# Patient Record
Sex: Female | Born: 1990 | Hispanic: Yes | Marital: Married
Health system: Southern US, Community
[De-identification: ages and names within clinical notes are randomized; demographics above are authoritative.]

## PROBLEM LIST (undated history)

## (undated) DIAGNOSIS — G43909 Migraine, unspecified, not intractable, without status migrainosus: Secondary | ICD-10-CM

---

## 2016-03-12 ENCOUNTER — Emergency Department
Admission: EM | Admit: 2016-03-12 | Discharge: 2016-03-13 | Disposition: A | Discharge status: Home / Self Care | Payer: Self-pay | Attending: Emergency Medicine | Admitting: Emergency Medicine

## 2016-03-12 ENCOUNTER — Encounter: Payer: Self-pay | Admitting: Emergency Medicine

## 2016-03-12 DIAGNOSIS — F419 Anxiety disorder, unspecified: Secondary | ICD-10-CM | POA: Insufficient documentation
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
## 2016-03-12 NOTE — Discharge Instructions (Signed)
Follow-up with your primary care provider or [facility] as discussed. Take care of each other and talk through your fears and concerns. Good luck to you.
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
## 2019-11-06 ENCOUNTER — Encounter (HOSPITAL_COMMUNITY): Payer: Self-pay | Admitting: *Deleted

## 2019-11-06 ENCOUNTER — Emergency Department (HOSPITAL_COMMUNITY): Payer: Self-pay

## 2019-11-06 ENCOUNTER — Other Ambulatory Visit: Payer: Self-pay

## 2019-11-06 ENCOUNTER — Emergency Department (HOSPITAL_COMMUNITY)
Admission: EM | Admit: 2019-11-06 | Discharge: 2019-11-07 | Disposition: A | Discharge status: Home / Self Care | Payer: Self-pay | Attending: Emergency Medicine | Admitting: Emergency Medicine

## 2019-11-06 DIAGNOSIS — R10A0 Flank pain, unspecified side: Secondary | ICD-10-CM

## 2019-11-06 DIAGNOSIS — M62830 Muscle spasm of back: Secondary | ICD-10-CM | POA: Insufficient documentation

## 2019-11-06 DIAGNOSIS — R1032 Left lower quadrant pain: Secondary | ICD-10-CM | POA: Insufficient documentation

## 2019-11-06 DIAGNOSIS — R109 Unspecified abdominal pain: Secondary | ICD-10-CM

## 2019-11-06 DIAGNOSIS — R9389 Abnormal findings on diagnostic imaging of other specified body structures: Secondary | ICD-10-CM

## 2019-11-06 DIAGNOSIS — M62838 Other muscle spasm: Secondary | ICD-10-CM

## 2019-11-06 LAB — URINALYSIS, ROUTINE W REFLEX MICROSCOPIC
Bilirubin Urine: NEGATIVE
Glucose, UA: NEGATIVE mg/dL
Hgb urine dipstick: NEGATIVE
Ketones, ur: NEGATIVE mg/dL
Leukocytes,Ua: NEGATIVE
Nitrite: NEGATIVE
Protein, ur: NEGATIVE mg/dL
Specific Gravity, Urine: 1.028 (ref 1.005–1.030)
pH: 6 (ref 5.0–8.0)

## 2019-11-06 LAB — BASIC METABOLIC PANEL
Anion gap: 8 (ref 5–15)
BUN: 13 mg/dL (ref 6–20)
CO2: 24 mmol/L (ref 22–32)
Calcium: 8.4 mg/dL — ABNORMAL LOW (ref 8.9–10.3)
Chloride: 105 mmol/L (ref 98–111)
Creatinine, Ser: 0.69 mg/dL (ref 0.44–1.00)
GFR calc Af Amer: >60 mL/min (ref >60)
GFR calc non Af Amer: >60 mL/min (ref >60)
Glucose, Bld: 108 mg/dL — ABNORMAL HIGH (ref 70–99)
Potassium: 4 mmol/L (ref 3.5–5.1)
Sodium: 137 mmol/L (ref 135–145)

## 2019-11-06 LAB — I-STAT BETA HCG BLOOD, ED (MC, WL, AP ONLY): I-stat hCG, quantitative: <5 m[IU]/mL (ref <5)

## 2019-11-06 LAB — CBC
HCT: 33.5 % — ABNORMAL LOW (ref 36.0–46.0)
Hemoglobin: 10.2 g/dL — ABNORMAL LOW (ref 12.0–15.0)
MCH: 23.8 pg — ABNORMAL LOW (ref 26.0–34.0)
MCHC: 30.4 g/dL (ref 30.0–36.0)
MCV: 78.3 fL — ABNORMAL LOW (ref 80.0–100.0)
Platelets: 291 10*3/uL (ref 150–400)
RBC: 4.28 MIL/uL (ref 3.87–5.11)
RDW: 16.7 % — ABNORMAL HIGH (ref 11.5–15.5)
WBC: 10.4 10*3/uL (ref 4.0–10.5)
nRBC: 0 % (ref 0.0–0.2)

## 2019-11-06 MED ORDER — KETOROLAC TROMETHAMINE 30 MG/ML IJ SOLN
30.0000 mg | Freq: Once | INTRAMUSCULAR | Status: AC
  Administered 2019-11-06: 30 mg via INTRAVENOUS
  Filled 2019-11-06: qty 1

## 2019-11-06 NOTE — ED Provider Notes (Signed)
[facility] EMERGENCY DEPARTMENT Provider Note   CSN: [id number] Arrival date & time: 11/06/19  2144     History Chief Complaint  Patient presents with  . Flank Pain    Jaime Griffin is a 29 y.o. female.  Patient presents to the emergency department with a chief complaint of left flank pain.  She reports having fairly mild left flank pain for the past week, but it suddenly worsened today.  She states that she is very uncomfortable.  She rates her pain as severe.  She denies any fevers or chills.  Denies nausea or vomiting.  Denies any injury.  Denies any successful treatments prior to arrival.  She is unable to find a comfortable position.  Denies any history of kidney stones.  The history is provided by the patient. No language interpreter was used.       History reviewed. No pertinent past medical history.  There are no problems to display for this patient.   Past Surgical History:  Procedure Laterality Date  . CESAREAN SECTION       OB History   No obstetric history on file.     No family history on file.  Social History   Tobacco Use  . Smoking status: Never Smoker  . Smokeless tobacco: Never Used  Substance Use Topics  . Alcohol use: Yes  . Drug use: No    Home Medications Prior to Admission medications   Not on File    Allergies    Patient has no known allergies.  Review of Systems   Review of Systems  All other systems reviewed and are negative.   Physical Exam Updated Vital Signs BP 117/82 (BP Location: Right Arm)   Pulse 68   Temp 98.3 F (36.8 C) (Oral)   Resp 18   LMP 10/15/2019   SpO2 100%   Physical Exam Vitals and nursing note reviewed.  Constitutional:      General: She is not in acute distress.    Appearance: She is well-developed.  HENT:     Head: Normocephalic and atraumatic.  Eyes:     Conjunctiva/sclera: Conjunctivae normal.  Cardiovascular:     Rate and Rhythm: Normal rate and regular rhythm.   Heart sounds: No murmur.  Pulmonary:     Effort: Pulmonary effort is normal. No respiratory distress.     Breath sounds: Normal breath sounds.  Abdominal:     Palpations: Abdomen is soft.     Tenderness: There is no abdominal tenderness. There is left CVA tenderness.  Musculoskeletal:     Cervical back: Neck supple.     Comments: Left-sided lumbar paraspinal muscles tender to palpation  Skin:    General: Skin is warm and dry.  Neurological:     Mental Status: She is alert and oriented to person, place, and time.  Psychiatric:        Mood and Affect: Mood normal.        Behavior: Behavior normal.     ED Results / Procedures / Treatments   Labs (all labs ordered are listed, but only abnormal results are displayed) Labs Reviewed  CBC - Abnormal; Notable for the following components:      Result Value   Hemoglobin 10.2 (*)    HCT 33.5 (*)    MCV 78.3 (*)    MCH 23.8 (*)    RDW 16.7 (*)    All other components within normal limits  BASIC METABOLIC PANEL - Abnormal; Notable for the following  components:   Glucose, Bld 108 (*)    Calcium 8.4 (*)    All other components within normal limits  URINALYSIS, ROUTINE W REFLEX MICROSCOPIC  I-STAT BETA HCG BLOOD, ED ([facility], [facility], [facility] ONLY)    EKG None  Radiology No results found.  Procedures Procedures (including critical care time)  Medications Ordered in ED Medications  ketorolac (TORADOL) 30 MG/ML injection 30 mg (has no administration in time range)    ED Course  I have reviewed the triage vital signs and the nursing notes.  Pertinent labs & imaging results that were available during my care of the patient were reviewed by me and considered in my medical decision making (see chart for details).    MDM Rules/Calculators/A&P                      Patient with significant left flank pain.  Pain have been mild for the the past week or so, but significantly worsened today without any injury.  She appears quite uncomfortable.   Differential includes kidney stone versus muscle spasm.  hCG negative.  CT scan is negative for obstructive uropathy.  There is incidental finding of thickened endometrial stripe.  I have recommended that the patient follow-up with OB/GYN for nonemergent ultrasound.  I suspect that the patient symptoms tonight are secondary to muscle spasm.  She feels improved with Toradol.  Will discharge home with Flexeril.   Final Clinical Impression(s) / ED Diagnoses Final diagnoses:  Flank pain  Muscle spasm  Increased endometrial stripe thickness    Rx / DC Orders ED Discharge Orders    None       Roxy Horseman, PA-C 11/07/19 0030    Palumbo, April, MD 11/07/19 8325

## 2019-11-06 NOTE — ED Triage Notes (Signed)
Left sided flank pain, tender to palpation for about a week. Denies urinary symptoms. Seen at [facility] prior, sent for further eval.

## 2019-11-07 MED ORDER — CYCLOBENZAPRINE HCL 10 MG PO TABS: 10.0000 mg | ORAL_TABLET | Freq: Two times a day (BID) | ORAL | 0 refills | Status: AC | PRN

## 2019-11-07 NOTE — Discharge Instructions (Signed)
This CT scan did not show any emergent causes for your pain.  Your pain is thought to be from muscle tightness and spasm.  There was an incidental finding on the CT scan which showed thickening of your endometrial stripe.  This can be an indication of uterine fibroids or other uterine pathology.  We recommend that you follow-up with an OB/GYN.  You will likely need a nonemergent ultrasound for further diagnostic work-up regarding this issue.  This is not thought to be the cause of your pain.  Please use ibuprofen and the muscle relaxer that was prescribed.  Please apply heat and do gentle stretching.  Return to the emergency department for fever, vomiting, or abdominal pain.

## 2019-11-07 NOTE — ED Notes (Signed)
Patient verbalizes understanding of discharge instructions. Opportunity for questioning and answers were provided. Armband removed by staff, pt discharged from ED. Pt. ambulatory and discharged home.  

## 2020-12-10 ENCOUNTER — Other Ambulatory Visit: Payer: Self-pay

## 2020-12-10 ENCOUNTER — Emergency Department (HOSPITAL_COMMUNITY)
Admission: EM | Admit: 2020-12-10 | Discharge: 2020-12-10 | Disposition: A | Discharge status: Home / Self Care | Payer: Self-pay | Attending: Emergency Medicine | Admitting: Emergency Medicine

## 2020-12-10 ENCOUNTER — Encounter (HOSPITAL_COMMUNITY): Payer: Self-pay | Admitting: Emergency Medicine

## 2020-12-10 DIAGNOSIS — R21 Rash and other nonspecific skin eruption: Secondary | ICD-10-CM | POA: Insufficient documentation

## 2020-12-10 MED ORDER — FAMOTIDINE 20 MG PO TABS: 20.0000 mg | ORAL_TABLET | Freq: Two times a day (BID) | ORAL | 0 refills | Status: AC

## 2020-12-10 MED ORDER — FAMOTIDINE 20 MG PO TABS
20.0000 mg | ORAL_TABLET | Freq: Once | ORAL | Status: AC
  Administered 2020-12-10: 20 mg via ORAL
  Filled 2020-12-10: qty 1

## 2020-12-10 NOTE — Discharge Instructions (Signed)
Please follow up with your PCP regarding your ED visit today. They may need to refer you to an allergist for specific allergy testing.   Please add the Pepcid twice daily to your benadryl and Prednisone regimen to help with your symptoms.   It is recommended that you wash all of your bedding, sheets, towels, clothes in free and clear detergent and to switch hygiene products to free and clear as you may be reacting to dyes or perfumes.   Return to the ED for any worsening symptoms including throat swelling, face swelling, shortness of breath, wheezing, abdominal pain, vomiting, or any other associated symptoms.

## 2020-12-10 NOTE — ED Triage Notes (Addendum)
C/o rash since Monday that started on hands and spread.  Also reports joint pain.  Seen at [facility] on Thursday and had negative step test.  Taking Prednisone and Penicillin without improvement.  Taking Benadryl and using cortisone, oatmeal, and aloe vera without relief.  Pt used gloves on Sunday and put lice treatment on her children.  No other new products.

## 2020-12-10 NOTE — ED Provider Notes (Signed)
[facility] EMERGENCY DEPARTMENT Provider Note   CSN: [id number] Arrival date & time: 12/10/20  1037     History Chief Complaint  Patient presents with  . Rash    Jaime Griffin is a 30 y.o. female who presents to the ED today with complaint of rash for the past 6 days.  Patient reports that she woke up Monday morning with an itchy rash to her bilateral hands that has since spread to her body.  She reports that a couple of days later she woke up with a fever at 102.0 as well as a sore throat.  She states she felt like something was inside her throat and it was swelling.  She went to urgent care at that point and they subsequently treated her for strep throat with prednisone and penicillin.  She was swabbed for strep at that time however it returned negative.  She reports that she was having some bilateral hand swelling which is seem to decrease with the prednisone however she continues to have itching.  She states she has been taking Benadryl 50 mg daily without relief of her symptoms.  She last took Benadryl approximately 1 hour ago.  She is currently on day 4 of prednisone pack.  She mentions that Sunday before she woke up with a rash she treated her children for lice however was wearing gloves when she put the shampoo on their heads.  She denies any other new foods, medications, hygiene products.  She denies any current sore throat, throat swelling, shortness of breath, wheezing, abdominal pain, nausea, vomiting.  She reports she has been unable to sleep very well due to the itchiness prompting her to come back to the ED today.   The history is provided by the patient and medical records.       History reviewed. No pertinent past medical history.  There are no problems to display for this patient.   Past Surgical History:  Procedure Laterality Date  . CESAREAN SECTION       OB History   No obstetric history on file.     No family history on file.  Social  History   Tobacco Use  . Smoking status: Never Smoker  . Smokeless tobacco: Never Used  Substance Use Topics  . Alcohol use: Yes  . Drug use: No    Home Medications Prior to Admission medications   Medication Sig Start Date End Date Taking? Authorizing Provider  famotidine (PEPCID) 20 MG tablet Take 1 tablet (20 mg total) by mouth 2 (two) times daily for 15 days. 12/10/20 12/25/20 Yes Naquan Garman, PA-C  cyclobenzaprine (FLEXERIL) 10 MG tablet Take 1 tablet (10 mg total) by mouth 2 (two) times daily as needed for muscle spasms. 11/07/19   Roxy Horseman, PA-C    Allergies    Patient has no known allergies.  Review of Systems   Review of Systems  Constitutional: Negative for chills and fever.  HENT: Negative for trouble swallowing.   Respiratory: Negative for shortness of breath and wheezing.   Gastrointestinal: Negative for nausea and vomiting.  Skin: Positive for rash.  All other systems reviewed and are negative.   Physical Exam Updated Vital Signs BP (!) 114/58 (BP Location: Left Arm)   Pulse 76   Temp 97.8 F (36.6 C) (Oral)   Resp 18   LMP 11/18/2020   SpO2 100%   Physical Exam Vitals and nursing note reviewed.  Constitutional:      Appearance: She is not  ill-appearing.  HENT:     Head: Normocephalic and atraumatic.     Mouth/Throat:     Comments: Phonating normally Eyes:     Conjunctiva/sclera: Conjunctivae normal.  Cardiovascular:     Rate and Rhythm: Normal rate and regular rhythm.  Pulmonary:     Effort: Pulmonary effort is normal.     Breath sounds: Normal breath sounds. No wheezing, rhonchi or rales.  Abdominal:     Tenderness: There is no abdominal tenderness. There is no guarding or rebound.  Skin:    General: Skin is warm and dry.     Coloration: Skin is not jaundiced.     Findings: Rash present.     Comments: Urticarial rash noted to left antecubital fossa where blood pressure cuff was situated. No obvious rash appreciated to bilateral  hands.   Neurological:     Mental Status: She is alert.     ED Results / Procedures / Treatments   Labs (all labs ordered are listed, but only abnormal results are displayed) Labs Reviewed - No data to display  EKG None  Radiology No results found.  Procedures Procedures   Medications Ordered in ED Medications  famotidine (PEPCID) tablet 20 mg (20 mg Oral Given 12/10/20 1142)    ED Course  I have reviewed the triage vital signs and the nursing notes.  Pertinent labs & imaging results that were available during my care of the patient were reviewed by me and considered in my medical decision making (see chart for details).    MDM Rules/Calculators/A&P                          30 year old female who presents to the ED today with complaint of urticarial rash for the past 6 days.  Seen at urgent care a few days ago after feeling like her throat was swelling and was sore and also incidentally had a fever.  She is currently being treated for strep throat with penicillin as well as prednisone for her rash without much improvement.  Continues to have itching prompting her to come to the ED today.  She had a rash prior to starting penicillin.  She has not had a fever since that day.  I have very low suspicion for mono at this time given rash was not exacerbated with antibiotic use.   On arrival to the ED vitals are stable.  Patient appears to be in no acute distress.  She does have a urticarial rash noted to her left antecubital fossa where the blood pressure cuff was.  She states that certain things will now irritate her and cause this rash that varies in location.  She states it is mostly in her hands however no obvious rash visualized to hands at this time.  She has no other symptoms concerning for anaphylaxis at this time.  Phonating normally.  No wheezes.  No oral involvement.  She took Benadryl 1 hour prior to arrival.  Is also currently taking prednisone and took it this morning.   Again given she does not have multiple system involvement she does not qualify for epinephrine at this time and I feel like this would be more harmful than beneficial.  Will provide oral Pepcid at this time to regimen.  I do feel patient will ultimately need to follow-up with allergist regarding continued symptoms. Instructed to follow up with PCP for same at the beginning of this week. Have also advised to wash all bedding,  towels, etc in free and clear detergent and to use hygiene products without dyes or perfumes as this could be causing reaction. Pt in agreement with plan and stable for discharge home.   This note was prepared using Dragon voice recognition software and may include unintentional dictation errors due to the inherent limitations of voice recognition software.  Final Clinical Impression(s) / ED Diagnoses Final diagnoses:  Rash and nonspecific skin eruption    Rx / DC Orders ED Discharge Orders         Ordered    famotidine (PEPCID) 20 MG tablet  2 times daily        12/10/20 1143           Discharge Instructions     Please follow up with your PCP regarding your ED visit today. They may need to refer you to an allergist for specific allergy testing.   Please add the Pepcid twice daily to your benadryl and Prednisone regimen to help with your symptoms.   It is recommended that you wash all of your bedding, sheets, towels, clothes in free and clear detergent and to switch hygiene products to free and clear as you may be reacting to dyes or perfumes.   Return to the ED for any worsening symptoms including throat swelling, face swelling, shortness of breath, wheezing, abdominal pain, vomiting, or any other associated symptoms.        Tanda Rockers, PA-C 12/10/20 1145    Pricilla Loveless, MD 12/10/20 1247

## 2021-09-12 IMAGING — CT CT RENAL STONE PROTOCOL
2 of 4 series · 16 of 46 positions shown, 18 images · non-contrast
Comparison: None.

CLINICAL DATA: Left flank pain.

EXAM:
CT ABDOMEN AND PELVIS WITHOUT CONTRAST
TECHNIQUE: Multidetector CT imaging of the abdomen and pelvis was performed
following the standard protocol without IV contrast. Automatic
exposure control utilized.

[Series 3: renal stone 5.0 · axial · 0.90mm/px · z∈[+684,+1154]mm · 13 of 104 slices shown, 15 images]
[im 5/104  soft-tissue]
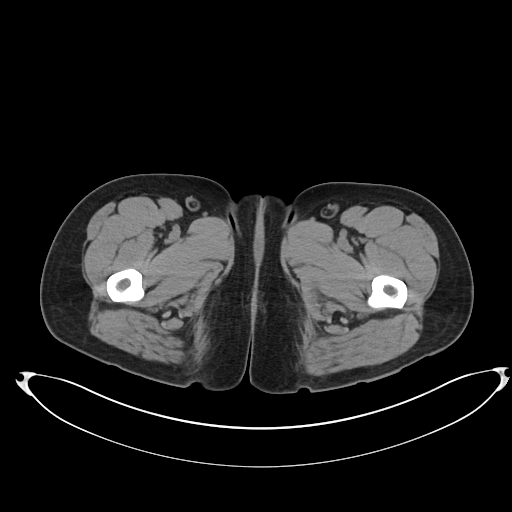
[im 5/104  bone]
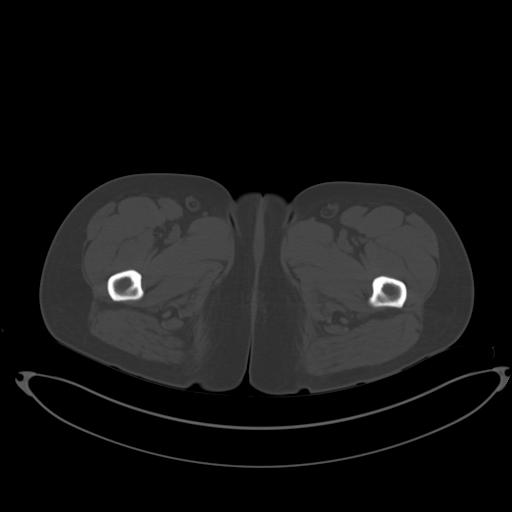
[im 13/104  soft-tissue]
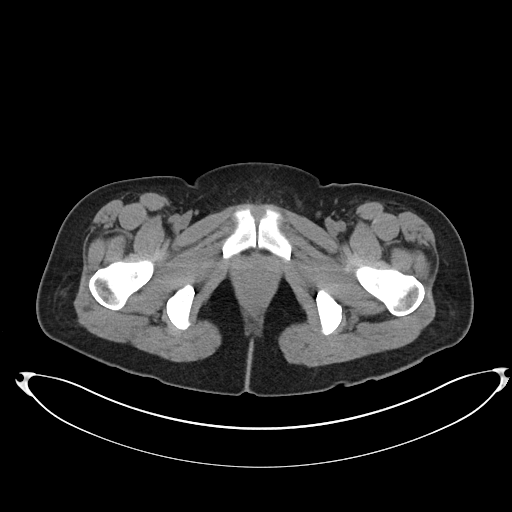
[im 21/104  soft-tissue]
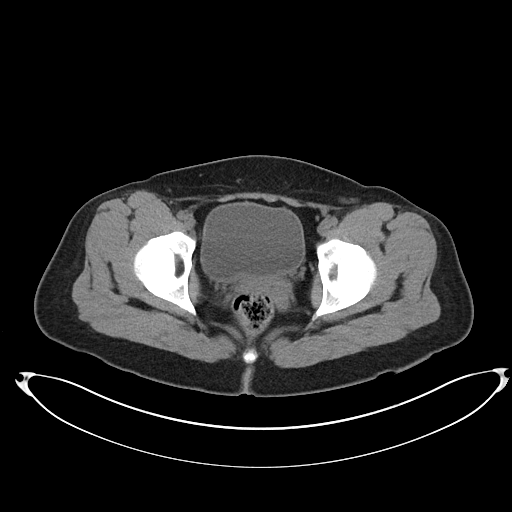
[im 29/104  soft-tissue]
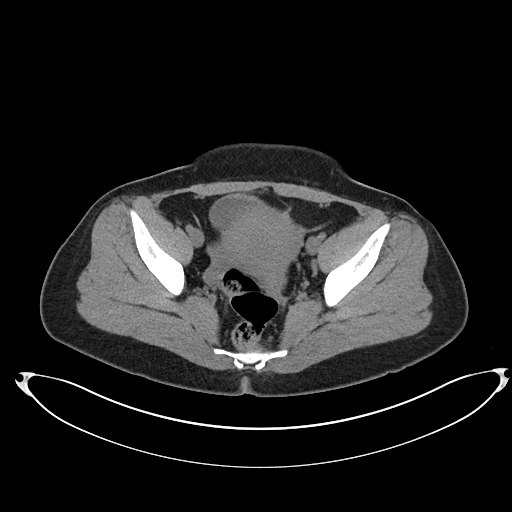
[im 38/104  soft-tissue]
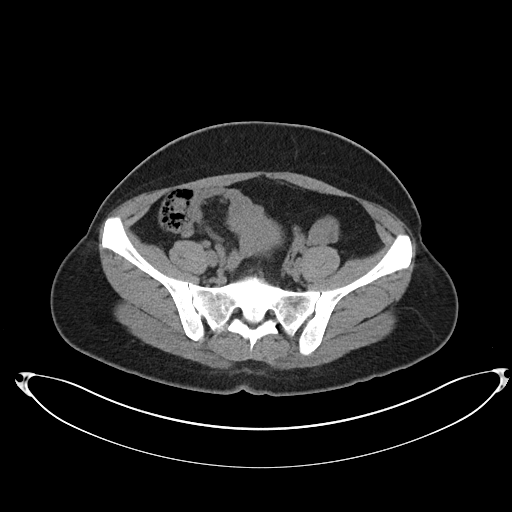
[im 46/104  soft-tissue]
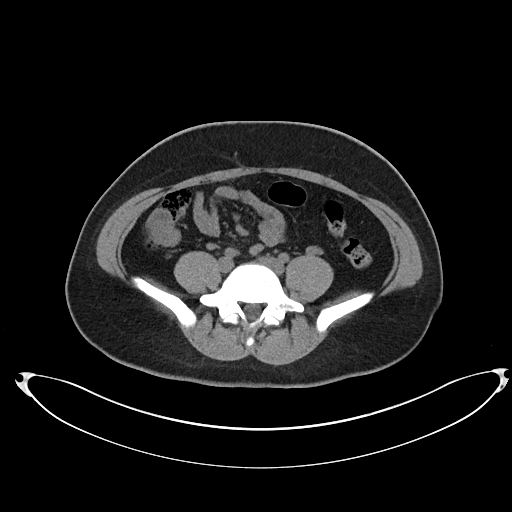
[im 54/104  soft-tissue]
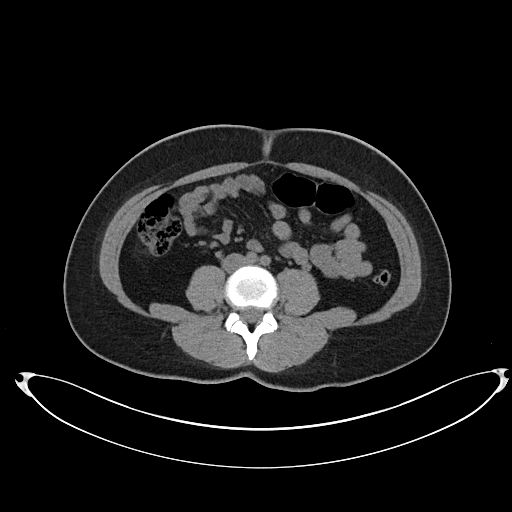
[im 58/104  soft-tissue]
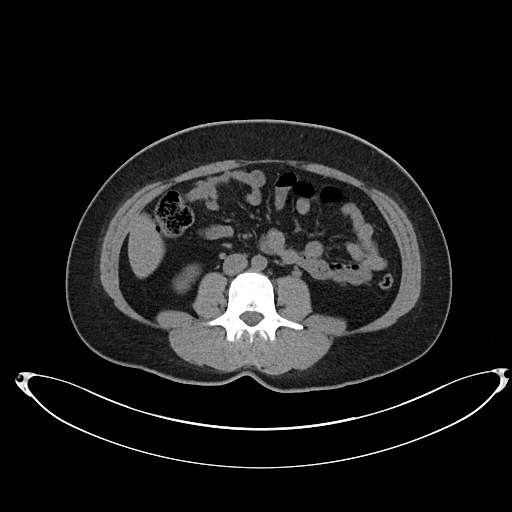
[im 66/104  soft-tissue]
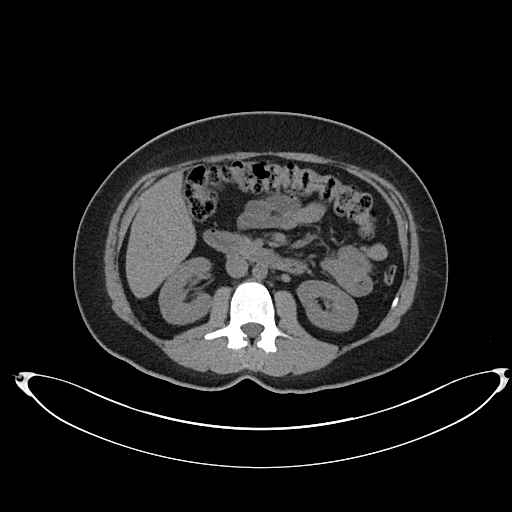
[im 66/104  bone]
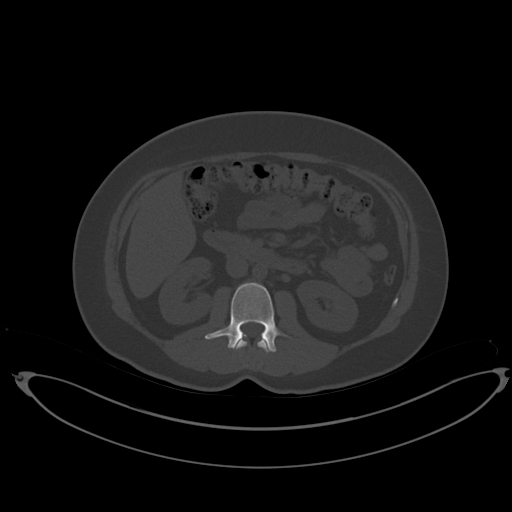
[im 75/104  soft-tissue]
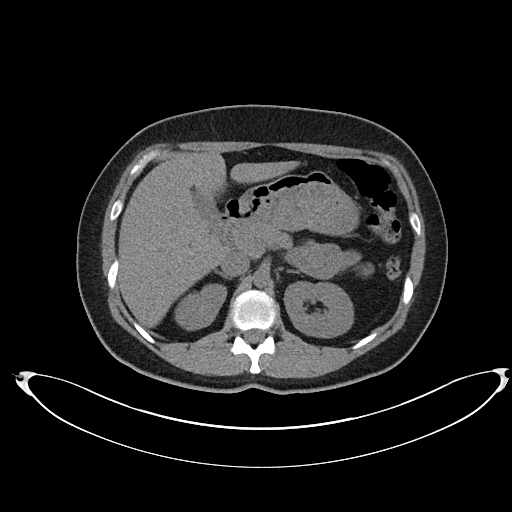
[im 83/104  soft-tissue]
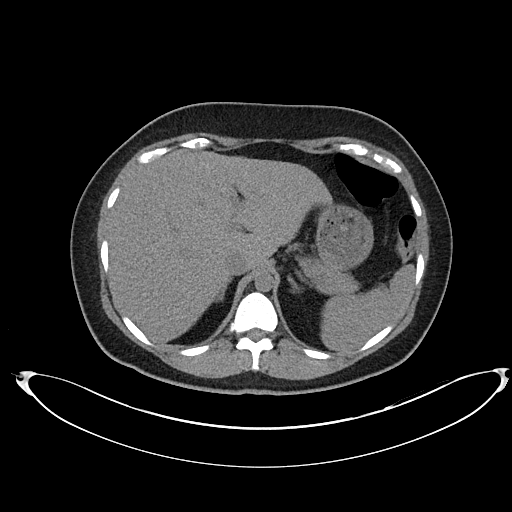
[im 91/104  soft-tissue]
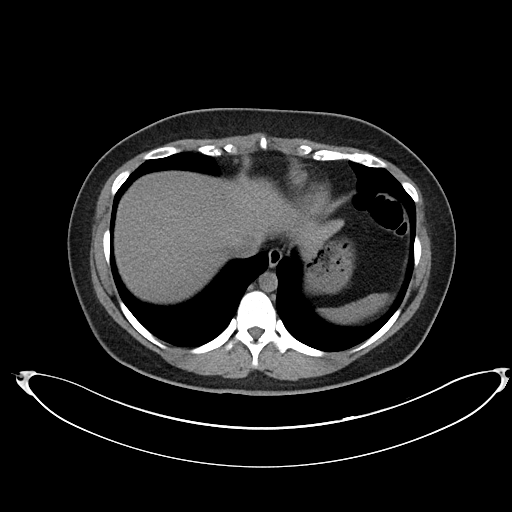
[im 99/104  soft-tissue]
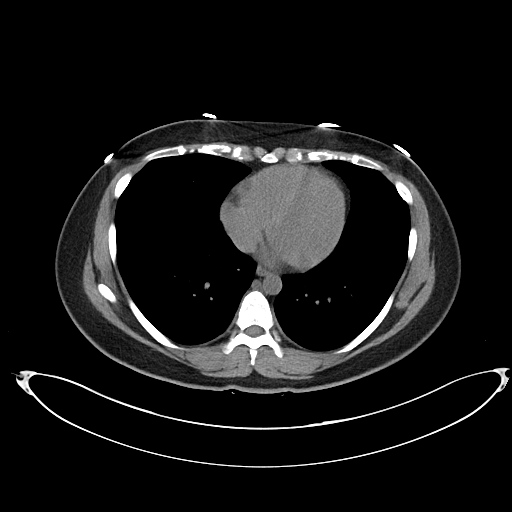

[Series 5: renal stone 3.0 cor · coronal · 0.90mm/px · 3 of 100 slices shown]
[im 34/100  soft-tissue]
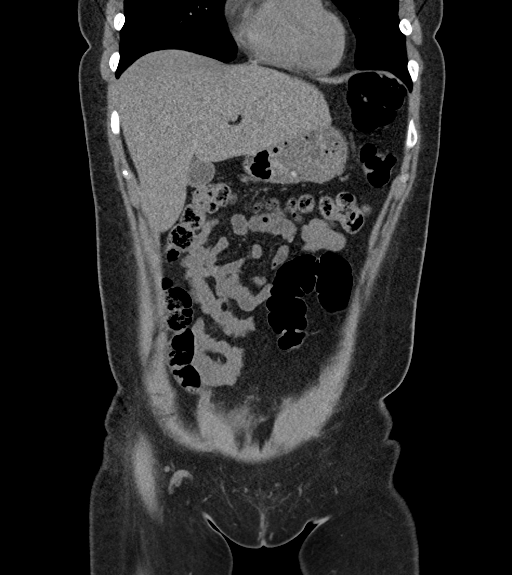
[im 45/100  soft-tissue]
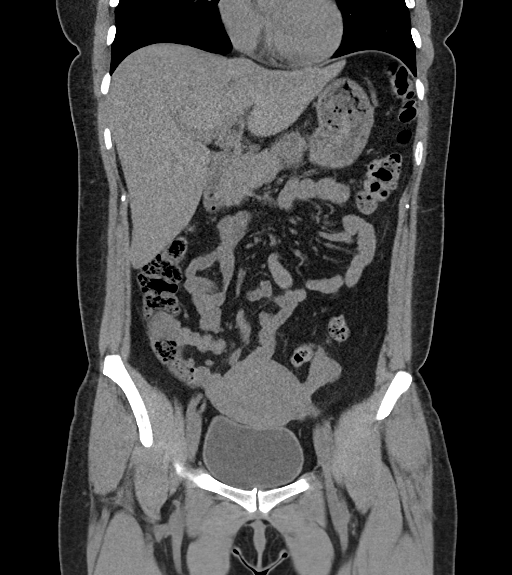
[im 56/100  soft-tissue]
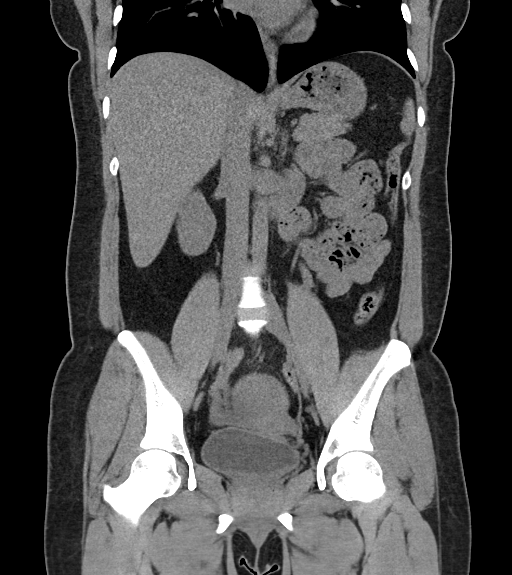

[16 of 46 positions shown; findings below may reference images not displayed]

FINDINGS: Lower chest: Normal.

Hepatobiliary: Normal.

Pancreas: Normal.

Spleen: Normal. Small splenule.

Adrenals/Urinary Tract: Normal. A couple punctate calcifications in
the pelvis, probably phleboliths extrinsic to the ureters.

Stomach/Bowel: Nonobstructed. No wall thickening. Moderate stool
burden. Normal appendix, series 3 image 68.

Vascular/Lymphatic: No abdominal aorta aneurysm or calcified
atherosclerosis. Congenital mild mass effect of the right common
iliac artery on the left common iliac vein.

Reproductive: Normally sized uterus and ovaries with prominent 23 mm
endometrial stripe thickness which may be secondary to uterine
fibroid disease, adenomyosis, an endometrial polyp or abnormal
endometrial thickening which can be benign or malignant. Nonspecific
small mesenteric lymph nodes. No adenopathy.

Other: None.

Musculoskeletal: Normal.
IMPRESSION: Normal bilateral kidneys and ureters. A couple punctate
calcifications in the pelvis that are likely extrinsic to the
ureters and nonobstructing.

Normally sized uterus and ovaries with abnormally prominent 23 mm
endometrial stripe thickness which may be secondary to uterine
fibroid disease, adenomyosis, an endometrial polyp or abnormal
benign or malignant endometrial thickening. Nonemergent
ultrasonography of the female pelvis recommended.

## 2022-12-21 ENCOUNTER — Emergency Department (HOSPITAL_COMMUNITY): Payer: Self-pay

## 2022-12-21 ENCOUNTER — Other Ambulatory Visit: Payer: Self-pay

## 2022-12-21 ENCOUNTER — Emergency Department (HOSPITAL_COMMUNITY)
Admission: EM | Admit: 2022-12-21 | Discharge: 2022-12-22 | Disposition: A | Discharge status: Home / Self Care | Payer: Self-pay | Attending: Emergency Medicine | Admitting: Emergency Medicine

## 2022-12-21 ENCOUNTER — Encounter (HOSPITAL_COMMUNITY): Payer: Self-pay

## 2022-12-21 DIAGNOSIS — M25512 Pain in left shoulder: Secondary | ICD-10-CM | POA: Insufficient documentation

## 2022-12-21 DIAGNOSIS — D509 Iron deficiency anemia, unspecified: Secondary | ICD-10-CM | POA: Insufficient documentation

## 2022-12-21 DIAGNOSIS — R200 Anesthesia of skin: Secondary | ICD-10-CM | POA: Insufficient documentation

## 2022-12-21 DIAGNOSIS — R531 Weakness: Secondary | ICD-10-CM | POA: Insufficient documentation

## 2022-12-21 DIAGNOSIS — R202 Paresthesia of skin: Secondary | ICD-10-CM | POA: Insufficient documentation

## 2022-12-21 DIAGNOSIS — R519 Headache, unspecified: Secondary | ICD-10-CM | POA: Insufficient documentation

## 2022-12-21 LAB — CBC WITH DIFFERENTIAL/PLATELET
Abs Immature Granulocytes: 0.06 10*3/uL (ref 0.00–0.07)
Basophils Absolute: 0.1 10*3/uL (ref 0.0–0.1)
Basophils Relative: 0 %
Eosinophils Absolute: 0.3 10*3/uL (ref 0.0–0.5)
Eosinophils Relative: 2 %
HCT: 27.9 % — ABNORMAL LOW (ref 36.0–46.0)
Hemoglobin: 8.2 g/dL — ABNORMAL LOW (ref 12.0–15.0)
Immature Granulocytes: 1 %
Lymphocytes Relative: 17 %
MCH: 19.8 pg — ABNORMAL LOW (ref 26.0–34.0)
MCHC: 29.4 g/dL — ABNORMAL LOW (ref 30.0–36.0)
MCV: 67.2 fL — ABNORMAL LOW (ref 80.0–100.0)
Monocytes Relative: 7 %
Neutro Abs: 8.3 10*3/uL — ABNORMAL HIGH (ref 1.7–7.7)
Neutrophils Relative %: 73 %
Platelets: 342 10*3/uL (ref 150–400)
RDW: 18.1 % — ABNORMAL HIGH (ref 11.5–15.5)
WBC: 11.5 10*3/uL — ABNORMAL HIGH (ref 4.0–10.5)
nRBC: 0 % (ref 0.0–0.2)

## 2022-12-21 LAB — RAPID URINE DRUG SCREEN, HOSP PERFORMED
Amphetamines: NOT DETECTED
Barbiturates: NOT DETECTED
Benzodiazepines: NOT DETECTED
Opiates: NOT DETECTED
Tetrahydrocannabinol: NOT DETECTED

## 2022-12-21 LAB — URINALYSIS, ROUTINE W REFLEX MICROSCOPIC
Bilirubin Urine: NEGATIVE
Hgb urine dipstick: NEGATIVE
Ketones, ur: NEGATIVE mg/dL

## 2022-12-21 LAB — PROTIME-INR
INR: 1.1 (ref 0.8–1.2)
Prothrombin Time: 14.1 seconds (ref 11.4–15.2)

## 2022-12-21 LAB — COMPREHENSIVE METABOLIC PANEL
ALT: 14 U/L (ref 0–44)
AST: 18 U/L (ref 15–41)
Albumin: 4 g/dL (ref 3.5–5.0)
BUN: 11 mg/dL (ref 6–20)
CO2: 22 mmol/L (ref 22–32)
Calcium: 8.7 mg/dL — ABNORMAL LOW (ref 8.9–10.3)
Chloride: 102 mmol/L (ref 98–111)
GFR, Estimated: >60 mL/min (ref >60)
Potassium: 3.8 mmol/L (ref 3.5–5.1)
Total Bilirubin: 0.2 mg/dL — ABNORMAL LOW (ref 0.3–1.2)
Total Protein: 7.9 g/dL (ref 6.5–8.1)

## 2022-12-21 LAB — SEDIMENTATION RATE: Sed Rate: 22 mm/hr (ref 0–22)

## 2022-12-21 LAB — TROPONIN I (HIGH SENSITIVITY)
Troponin I (High Sensitivity): 5 ng/L (ref <18)
Troponin I (High Sensitivity): 3 ng/L (ref <18)

## 2022-12-21 LAB — I-STAT CHEM 8, ED
Calcium, Ion: 1.18 mmol/L (ref 1.15–1.40)
Chloride: 103 mmol/L (ref 98–111)
Glucose, Bld: 90 mg/dL (ref 70–99)
HCT: 29 % — ABNORMAL LOW (ref 36.0–46.0)
TCO2: 22 mmol/L (ref 22–32)

## 2022-12-21 LAB — APTT: aPTT: 32 seconds (ref 24–36)

## 2022-12-21 LAB — I-STAT BETA HCG BLOOD, ED (MC, WL, AP ONLY): I-stat hCG, quantitative: <5 m[IU]/mL (ref <5)

## 2022-12-21 LAB — ETHANOL: Alcohol, Ethyl (B): <10 mg/dL (ref <10)

## 2022-12-21 LAB — C-REACTIVE PROTEIN: CRP: <0.5 mg/dL (ref <1.0)

## 2022-12-21 MED ORDER — PROCHLORPERAZINE EDISYLATE 10 MG/2ML IJ SOLN
5.0000 mg | Freq: Once | INTRAMUSCULAR | Status: AC
  Administered 2022-12-21: 5 mg via INTRAVENOUS
  Filled 2022-12-21: qty 2

## 2022-12-21 MED ORDER — KETOROLAC TROMETHAMINE 15 MG/ML IJ SOLN
15.0000 mg | Freq: Once | INTRAMUSCULAR | Status: AC
  Administered 2022-12-21: 15 mg via INTRAVENOUS
  Filled 2022-12-21: qty 1

## 2022-12-21 MED ORDER — DIPHENHYDRAMINE HCL 50 MG/ML IJ SOLN
12.5000 mg | Freq: Once | INTRAMUSCULAR | Status: AC
  Administered 2022-12-21: 12.5 mg via INTRAVENOUS
  Filled 2022-12-21: qty 1

## 2022-12-21 MED ORDER — IOHEXOL 350 MG/ML SOLN
150.0000 mL | Freq: Once | INTRAVENOUS | Status: AC | PRN
  Administered 2022-12-21: 150 mL via INTRAVENOUS

## 2022-12-21 NOTE — Consult Note (Signed)
Neurology Consultation Reason for Consult: Code stroke  Requesting Physician: Gerhard Munch  CC: Left upper extremity pain x 2 days progressing to weakness today  History is obtained from: Patient and chart review   HPI: Jaime Griffin is a 32 y.o. female with a past medical history of gestational hypertension, gestational diabetes, thyroid issues, migraine headaches, and intermittent left upper extremity numbness at times of stress.  She reports that since she had possible cellulitis of the left upper extremity 2 years ago she has had intermittent numbness in her left upper extremity usually in the fingertips that tends to be triggered by stress.  This was after an episode of fever and she reported rash and swelling.  On review of ED records at the time, by the time of her presentation to the ED there was no rash on their examination and she was instructed to follow-up with outpatient allergist for her symptoms  She notes that she had symptoms on waking on 5/2 with some slight pain in her left shoulder that she attributed to her child sleeping with her and putting his head on her shoulder.  This continued throughout the day but today in the morning she had slightly worse pain.  Around 6 PM today she had an episode of feeling dizzy, severe left arm numbness, pleuritic chest pain and some shortness of breath and feeling very hot while she was driving home.  She was able to make it home but asked her child to get her husband due to her severe symptoms, she was brought to the ED for evaluation and code stroke was activated.   She additionally notes she began to have her typical migraine headache. She has longstanding history of migraines for the past 7 to 8 years.  She is having some light and sound sensitivity but no nausea/vomiting.   LKW: 5/1 evening Thrombolytic given?: No, out of the window IA performed?: No, no LVO Premorbid modified rankin scale:      1 - No significant disability. Able to  carry out all usual activities, despite some symptoms.    ROS: All other review of systems was negative except as noted in the HPI.    Medical history reviewed, see HPI above  Family history: Maternal Grandmother with stroke reported at a young age  Maternal aunt with stroke recently (age 11)  Denies history of multiple sclerosis  Social History:  has no history on file for tobacco use, alcohol use, and drug use.  Exam: Current vital signs: BP 122/80   Pulse 79   Temp 98.9 F (37.2 C) (Oral)   Resp 15   SpO2 100%  Vital signs in last 24 hours: Temp:  [98.9 F (37.2 C)] 98.9 F (37.2 C) (05/03 1943) Pulse Rate:  [79-91] 79 (05/03 2036) Resp:  [15-22] 15 (05/03 2036) BP: (120-122)/(80-87) 122/80 (05/03 2035) SpO2:  [96 %-100 %] 100 % (05/03 2036)   Physical Exam  Constitutional: Appears well-developed and well-nourished.  Psych: Affect anxious but cooperative  Eyes: No scleral injection HENT: No oropharyngeal obstruction.  MSK: no joint deformities. Shoulder pain with movement of the LUE  Cardiovascular: Normal rate and regular rhythm. Perfusing extremities well Respiratory: Effort normal, non-labored breathing GI: Soft.  No distension. There is no tenderness.  Skin: Warm dry and intact visible skin  Neuro: Mental Status: Patient is awake, alert, oriented to person, place, month, year, and situation, but at times she is slow to respond and stares off Patient is able to give a clear  and coherent history.  She is unable to name lenses/frame and unable to repeat complex sentences but in casual speech she makes no paraphasic errors and relates an excellent history Cranial Nerves: II: Visual Fields are full (intermittently not responsive to the left-sided stimuli). Pupils are equal, round, and reactive to light, initially some slight concern for a left APD but this was not reproducible and may have been hippus III,IV, VI: EOMI without ptosis or diploplia.  Brief left  beating nystagmus on left gaze initially, not reproducible V: Facial sensation is symmetric to temperature and light touch VII: Facial movement is symmetric.  VIII: hearing is intact to voice X: Uvula elevates symmetrically XI: Shoulder shrug is limited on the left due to pain XII: tongue is midline without atrophy or fasciculations.  Motor: Tone is normal. Bulk is normal.  Left upper extremity examination is highly pain limited and she does not maintain this limb antigravity.  Otherwise no drift on the right.  Slight drift in the left leg, no drift in the right Sensory: She reports severe loss of sensation in the left arm only Deep Tendon Reflexes: 2+ and symmetric in the brachioradialis and patellae.  Cerebellar: Finger-nose intact within limits of pain.  Heel-to-shin intact bilaterally Gait:  Deferred in acute setting   NIHSS total 8  Score breakdown: 3 points for left upper extremity weakness, 1 point for left lower extremity weakness, 2 points for sensory loss on the left side, 1 point for mild aphasia, 1 point for mild dysarthria  I have reviewed labs in epic and the results pertinent to this consultation are:  Basic Metabolic Panel: Recent Labs  Lab 12/21/22 2023  NA 138  K 3.7  CL 103  GLUCOSE 90  BUN 12  CREATININE 0.70    CBC: Recent Labs  Lab 12/21/22 27-Jan-2033  WBC 11.5*  NEUTROABS 8.3*  HGB 8.2*  HCT 27.9*  MCV 67.2*  PLT 342   Component Ref Range & Units 3 yr ago  WBC 4.0 - 10.5 K/uL 10.4  RBC 3.87 - 5.11 MIL/uL 4.28  Hemoglobin 12.0 - 15.0 g/dL 16.1 Low   HCT 09.6 - 04.5 % 33.5 Low   MCV 80.0 - 100.0 fL 78.3 Low   MCH 26.0 - 34.0 pg 23.8 Low   MCHC 30.0 - 36.0 g/dL 40.9  RDW 81.1 - 91.4 % 16.7 High   Platelets 150 - 400 K/uL 291  nRBC 0.0 - 0.2 % 0.0   Coagulation Studies: Recent Labs    12/21/22 2020-01-28  LABPROT 14.1  INR 1.1      I have reviewed the images obtained:  Head CT personally reviewed, agree with radiology:   no acute  intracranial process    Impression: There are some nonorganic elements on her examination and suspect this may be a complex migraine versus functional neurologic disorder.  However given the severity and acuity of her symptoms as well as her age, I will start by ruling out dissection as well as by treating symptomatically for migraine headache.  Code stroke recommendations: - CTA head and neck to rule out dissection - CTA PE protocol ordered given pleuritic chest pain in discussion with ED provider, appreciate their further management/workup for this issue - ESR CRP to eval for potential brachial plexopathy  - Torodol 15 mg, compazine 5 mg, benadryl 12.5 mg once for migraine  - If CTA studies are unrevealing and symptoms are presistent will consider MRI brain w/ and w/o and MRI C-spine - Final recommendations  pending these studies and clinical course   Additional recommendations: - Due to persistent symptoms, MRI Brain w/ and w/o and MRI C-spine w/ and w/o  - If these studies are negative, please provide patient with handout on functional limb weakness and outpatient neurology follow-up for migraine management [email or web address]  Discussed with EDP via secure chat  Brooke Dare MD-PhD [facility] [phone] Available 7 PM to 7 AM, outside of these hours please call Neurologist on call as listed on Amion.  Total critical care time: 40 minutes   Critical care time was exclusive of separately billable procedures and treating other patients.   Critical care was necessary to treat or prevent imminent or life-threatening deterioration.   Critical care was time spent personally by me on the following activities: development of treatment plan with patient and/or surrogate as well as nursing, discussions with consultants/primary team, evaluation of patient's response to treatment, examination of patient, obtaining history from  patient or surrogate, ordering and performing treatments and interventions, ordering and review of laboratory studies, ordering and review of radiographic studies, and re-evaluation of patient's condition as needed, as documented above.

## 2022-12-22 MED ORDER — GADOBUTROL 1 MMOL/ML IV SOLN
8.0000 mL | Freq: Once | INTRAVENOUS | Status: AC | PRN
  Administered 2022-12-22: 8 mL via INTRAVENOUS

## 2023-01-02 ENCOUNTER — Encounter (HOSPITAL_COMMUNITY): Payer: Self-pay | Admitting: Emergency Medicine

## 2023-10-01 ENCOUNTER — Other Ambulatory Visit: Payer: Self-pay

## 2023-10-01 ENCOUNTER — Emergency Department (HOSPITAL_COMMUNITY)
Admission: EM | Admit: 2023-10-01 | Discharge: 2023-10-01 | Discharge status: Against Medical Advice | Payer: Self-pay | Attending: Emergency Medicine | Admitting: Emergency Medicine

## 2023-10-01 ENCOUNTER — Encounter (HOSPITAL_COMMUNITY): Payer: Self-pay

## 2023-10-01 DIAGNOSIS — Z5321 Procedure and treatment not carried out due to patient leaving prior to being seen by health care provider: Secondary | ICD-10-CM | POA: Insufficient documentation

## 2023-10-01 DIAGNOSIS — R519 Headache, unspecified: Secondary | ICD-10-CM | POA: Insufficient documentation

## 2023-10-01 DIAGNOSIS — R1110 Vomiting, unspecified: Secondary | ICD-10-CM | POA: Insufficient documentation

## 2023-10-01 HISTORY — DX: Migraine, unspecified, not intractable, without status migrainosus: G43.909

## 2023-10-01 MED ORDER — ACETAMINOPHEN 500 MG PO TABS
1000.0000 mg | ORAL_TABLET | Freq: Once | ORAL | Status: AC
  Administered 2023-10-01: 1000 mg via ORAL
  Filled 2023-10-01: qty 2

## 2023-10-01 MED ORDER — METOCLOPRAMIDE HCL 10 MG PO TABS
10.0000 mg | ORAL_TABLET | Freq: Once | ORAL | Status: AC
  Administered 2023-10-01: 10 mg via ORAL
  Filled 2023-10-01: qty 1

## 2023-10-01 MED ORDER — DIPHENHYDRAMINE HCL 25 MG PO CAPS
25.0000 mg | ORAL_CAPSULE | Freq: Once | ORAL | Status: AC
  Administered 2023-10-01: 25 mg via ORAL
  Filled 2023-10-01: qty 1

## 2023-10-01 NOTE — ED Notes (Signed)
Called patient name 3x no answer

## 2023-10-01 NOTE — ED Triage Notes (Signed)
Patient reports headache that started this afternoon.  Reports hx of migraines this one is a little different just bc of location and had some vomiting.  Reports light sensitivity but has not tried tylenol or motrin.

## 2024-04-19 ENCOUNTER — Emergency Department: Payer: Self-pay

## 2024-04-19 ENCOUNTER — Other Ambulatory Visit: Payer: Self-pay

## 2024-04-19 ENCOUNTER — Emergency Department
Admission: EM | Admit: 2024-04-19 | Discharge: 2024-04-19 | Disposition: A | Discharge status: Home / Self Care | Payer: Self-pay | Attending: Emergency Medicine | Admitting: Emergency Medicine

## 2024-04-19 DIAGNOSIS — G43109 Migraine with aura, not intractable, without status migrainosus: Secondary | ICD-10-CM | POA: Insufficient documentation

## 2024-04-19 DIAGNOSIS — R531 Weakness: Secondary | ICD-10-CM | POA: Insufficient documentation

## 2024-04-19 LAB — DIFFERENTIAL
Abs Immature Granulocytes: 0.05 K/uL (ref 0.00–0.07)
Basophils Absolute: 0 K/uL (ref 0.0–0.1)
Basophils Relative: 0 %
Eosinophils Absolute: 0.1 K/uL (ref 0.0–0.5)
Eosinophils Relative: 1 %
Immature Granulocytes: 0 %
Lymphocytes Relative: 10 %
Lymphs Abs: 1.2 K/uL (ref 0.7–4.0)
Monocytes Absolute: 0.4 K/uL (ref 0.1–1.0)
Monocytes Relative: 3 %
Neutro Abs: 10.2 K/uL — ABNORMAL HIGH (ref 1.7–7.7)
Neutrophils Relative %: 86 %

## 2024-04-19 LAB — COMPREHENSIVE METABOLIC PANEL WITH GFR
ALT: 18 U/L (ref 0–44)
AST: 27 U/L (ref 15–41)
Albumin: 3.9 g/dL (ref 3.5–5.0)
Alkaline Phosphatase: 82 U/L (ref 38–126)
Anion gap: 9 (ref 5–15)
BUN: 17 mg/dL (ref 6–20)
CO2: 22 mmol/L (ref 22–32)
Calcium: 8.9 mg/dL (ref 8.9–10.3)
Chloride: 105 mmol/L (ref 98–111)
Creatinine, Ser: 0.62 mg/dL (ref 0.44–1.00)
GFR, Estimated: >60 mL/min (ref >60)
Glucose, Bld: 112 mg/dL — ABNORMAL HIGH (ref 70–99)
Potassium: 4 mmol/L (ref 3.5–5.1)
Sodium: 136 mmol/L (ref 135–145)
Total Bilirubin: 0.4 mg/dL (ref 0.0–1.2)
Total Protein: 7.7 g/dL (ref 6.5–8.1)

## 2024-04-19 LAB — CBC
HCT: 30.9 % — ABNORMAL LOW (ref 36.0–46.0)
Hemoglobin: 9.4 g/dL — ABNORMAL LOW (ref 12.0–15.0)
MCH: 21.7 pg — ABNORMAL LOW (ref 26.0–34.0)
MCHC: 30.4 g/dL (ref 30.0–36.0)
MCV: 71.4 fL — ABNORMAL LOW (ref 80.0–100.0)
Platelets: 330 K/uL (ref 150–400)
RBC: 4.33 MIL/uL (ref 3.87–5.11)
RDW: 17.3 % — ABNORMAL HIGH (ref 11.5–15.5)
WBC: 11.9 K/uL — ABNORMAL HIGH (ref 4.0–10.5)
nRBC: 0 % (ref 0.0–0.2)

## 2024-04-19 LAB — PROTIME-INR
INR: 1.1 (ref 0.8–1.2)
Prothrombin Time: 14.4 s (ref 11.4–15.2)

## 2024-04-19 LAB — CBG MONITORING, ED: Glucose-Capillary: 111 mg/dL — ABNORMAL HIGH (ref 70–99)

## 2024-04-19 LAB — POC URINE PREG, ED: Preg Test, Ur: NEGATIVE

## 2024-04-19 LAB — HCG, QUANTITATIVE, PREGNANCY: hCG, Beta Chain, Quant, S: 1 m[IU]/mL (ref <5)

## 2024-04-19 LAB — ETHANOL: Alcohol, Ethyl (B): <15 mg/dL (ref <15)

## 2024-04-19 LAB — APTT: aPTT: 31 s (ref 24–36)

## 2024-04-19 MED ORDER — PROCHLORPERAZINE MALEATE 10 MG PO TABS: 10.0000 mg | ORAL_TABLET | Freq: Four times a day (QID) | ORAL | 0 refills | Status: AC | PRN

## 2024-04-19 MED ORDER — PROCHLORPERAZINE EDISYLATE 10 MG/2ML IJ SOLN
10.0000 mg | Freq: Once | INTRAMUSCULAR | Status: AC
  Administered 2024-04-19: 10 mg via INTRAVENOUS
  Filled 2024-04-19: qty 2

## 2024-04-19 MED ORDER — DIPHENHYDRAMINE HCL 50 MG/ML IJ SOLN
25.0000 mg | Freq: Once | INTRAMUSCULAR | Status: AC
  Administered 2024-04-19: 25 mg via INTRAVENOUS
  Filled 2024-04-19: qty 1

## 2024-04-19 MED ORDER — SODIUM CHLORIDE 0.9 % IV BOLUS
1000.0000 mL | Freq: Once | INTRAVENOUS | Status: AC
  Administered 2024-04-19: 1000 mL via INTRAVENOUS

## 2024-04-19 NOTE — ED Provider Notes (Signed)
 [facility] Provider Note    Event Date/Time   First MD Initiated Contact with Patient 04/19/24 2142     (approximate)   History   Chief Complaint Numbness, Weakness, and Extremity Weakness   HPI  Jaime Griffin is a 33 y.o. female with past medical history of migraines who presents to the ED complaining of weakness.  Patient reports that last night she first started to notice some numbness and tingling in the fingers of her right hand.  Symptoms were worse when she woke up this morning, when the numbness extended through much of her right arm with some painful tingling and difficulty lifting up her arm.  She has had similar symptoms in the past with migraines, states she has been dealing with a waxing and waning headache for the past 2 weeks.  When she began to have some numbness over the right side of her face, she decided to seek care in the ED.  She denies any numbness or weakness in her legs, and has not had any vision or speech changes.     Physical Exam   Triage Vital Signs: ED Triage Vitals  Encounter Vitals Group     BP 04/19/24 2006 117/68     Girls Systolic BP Percentile --      Girls Diastolic BP Percentile --      Boys Systolic BP Percentile --      Boys Diastolic BP Percentile --      Pulse Rate 04/19/24 2006 65     Resp 04/19/24 2006 20     Temp 04/19/24 2006 98.9 F (37.2 C)     Temp Source 04/19/24 2006 Oral     SpO2 04/19/24 2006 100 %     Weight 04/19/24 2007 170 lb (77.1 kg)     Height 04/19/24 2007 5' 5 (1.651 m)     Head Circumference --      Peak Flow --      Pain Score 04/19/24 2007 5     Pain Loc --      Pain Education --      Exclude from Growth Chart --     Most recent vital signs: Vitals:   04/19/24 2210 04/19/24 2328  BP:  125/76  Pulse:  71  Resp:  16  Temp:  98.5 F (36.9 C)  SpO2: 100% 100%    Constitutional: Alert and oriented. Eyes: Conjunctivae are normal. Head: Atraumatic. Nose: No  congestion/rhinnorhea. Mouth/Throat: Mucous membranes are moist.  Cardiovascular: Normal rate, regular rhythm. Grossly normal heart sounds.  2+ radial pulses bilaterally. Respiratory: Normal respiratory effort.  No retractions. Lungs CTAB. Gastrointestinal: Soft and nontender. No distention. Musculoskeletal: No lower extremity tenderness nor edema.  Neurologic:  Normal speech and language.  Cranial nerves II through XII grossly intact with no appreciable facial droop.  Strength out of 5 in bilateral lower extremities, strength 5 out of 5 in left upper extremity and 4 out of 5 in right upper extremity.    ED Results / Procedures / Treatments   Labs (all labs ordered are listed, but only abnormal results are displayed) Labs Reviewed  CBC - Abnormal; Notable for the following components:      Result Value   WBC 11.9 (*)    Hemoglobin 9.4 (*)    HCT 30.9 (*)    MCV 71.4 (*)    MCH 21.7 (*)    RDW 17.3 (*)    All other components within normal limits  DIFFERENTIAL -  Abnormal; Notable for the following components:   Neutro Abs 10.2 (*)    All other components within normal limits  COMPREHENSIVE METABOLIC PANEL WITH GFR - Abnormal; Notable for the following components:   Glucose, Bld 112 (*)    All other components within normal limits  CBG MONITORING, ED - Abnormal; Notable for the following components:   Glucose-Capillary 111 (*)    All other components within normal limits  POC URINE PREG, ED - Normal  PROTIME-INR  APTT  ETHANOL  HCG, QUANTITATIVE, PREGNANCY     EKG  ED ECG REPORT I, Carlin Palin, the attending physician, personally viewed and interpreted this ECG.   Date: 04/19/2024  EKG Time: 20:12  Rate: 72  Rhythm: normal sinus rhythm  Axis: Normal  Intervals:none  ST&T Change: None  RADIOLOGY CT head reviewed and interpreted by me with no hemorrhage or midline shift.  PROCEDURES:  Critical Care performed: No  Procedures   MEDICATIONS ORDERED IN  ED: Medications  prochlorperazine  (COMPAZINE ) injection 10 mg (10 mg Intravenous Given 04/19/24 2224)  diphenhydrAMINE  (BENADRYL ) injection 25 mg (25 mg Intravenous Given 04/19/24 2225)  sodium chloride  0.9 % bolus 1,000 mL (0 mLs Intravenous Stopped 04/19/24 2328)     IMPRESSION / MDM / ASSESSMENT AND PLAN / ED COURSE  I reviewed the triage vital signs and the nursing notes.                              33 y.o. female with past medical history of migraines who presents to the ED complaining of waxing and waning headache for the past 2 weeks with right arm numbness and weakness today.  Patient's presentation is most consistent with acute presentation with potential threat to life or bodily function.  Differential diagnosis includes, but is not limited to, stroke, TIA, complicated migraine, SAH, anemia, electrolyte abnormality, AKI.  Patient nontoxic-appearing and in no acute distress, vital signs are unremarkable.  She does seem to have some numbness in her right upper extremity, but last known well time was last night and she is outside the window for intervention on acute stroke.  No findings to suggest LVO and CT head is negative for acute process.  Labs with stable anemia, no significant leukocytosis, electrolyte abnormality, or AKI.  LFTs are unremarkable, no symptoms to suggest SAH or meningitis.  She was treated with migraine cocktail including IV Compazine  and Benadryl , states that she feels much better on reassessment.  Repeat neurologic exam with intact strength throughout and patient reports feeling essentially back to normal.  Do not feel MRI indicated at this time, suspect complicated migraine and patient appropriate for outpatient management with neurology referral.  She was counseled to return to the ED for new or worsening symptoms, patient agrees with plan.      FINAL CLINICAL IMPRESSION(S) / ED DIAGNOSES   Final diagnoses:  Complicated migraine  Right sided weakness      Rx / DC Orders   ED Discharge Orders          Ordered    prochlorperazine  (COMPAZINE ) 10 MG tablet  Every 6 hours PRN        04/19/24 2259             Note:  This document was prepared using Dragon voice recognition software and may include unintentional dictation errors.   Palin Carlin, MD 04/19/24 [phone]

## 2024-04-19 NOTE — ED Notes (Signed)
 Discussed patient case with Willo, MD over the phone.

## 2024-04-19 NOTE — ED Triage Notes (Signed)
 Patient ambulatory to triage with complaints of numbness and right arm weakness that started around 0630am this morning. Patient states she has hx of migraines that can cause this, thought it would go away but approx 1 hour ago started having right sided facial numbness around the mouth particularly. Patient does have some grip strip and weakness on the right on this RN assessment. Denies blood thinners.

## 2024-06-03 ENCOUNTER — Other Ambulatory Visit: Payer: Self-pay

## 2024-06-03 ENCOUNTER — Encounter: Payer: Self-pay | Admitting: Emergency Medicine

## 2024-06-03 ENCOUNTER — Emergency Department
Admission: EM | Admit: 2024-06-03 | Discharge: 2024-06-04 | Disposition: A | Discharge status: Home / Self Care | Payer: Self-pay | Attending: Emergency Medicine | Admitting: Emergency Medicine

## 2024-06-03 DIAGNOSIS — R10A1 Flank pain, right side: Secondary | ICD-10-CM | POA: Insufficient documentation

## 2024-06-03 DIAGNOSIS — R1011 Right upper quadrant pain: Secondary | ICD-10-CM | POA: Insufficient documentation

## 2024-06-03 DIAGNOSIS — D72829 Elevated white blood cell count, unspecified: Secondary | ICD-10-CM | POA: Insufficient documentation

## 2024-06-03 LAB — HEPATIC FUNCTION PANEL
ALT: 15 U/L (ref 0–44)
AST: 21 U/L (ref 15–41)
Albumin: 3.7 g/dL (ref 3.5–5.0)
Alkaline Phosphatase: 81 U/L (ref 38–126)
Bilirubin, Direct: <0.1 mg/dL (ref 0.0–0.2)
Total Bilirubin: 0.3 mg/dL (ref 0.0–1.2)
Total Protein: 7.5 g/dL (ref 6.5–8.1)

## 2024-06-03 LAB — BASIC METABOLIC PANEL WITH GFR
Anion gap: 10 (ref 5–15)
BUN: 15 mg/dL (ref 6–20)
CO2: 24 mmol/L (ref 22–32)
Calcium: 8.5 mg/dL — ABNORMAL LOW (ref 8.9–10.3)
Chloride: 102 mmol/L (ref 98–111)
Creatinine, Ser: 0.71 mg/dL (ref 0.44–1.00)
GFR, Estimated: >60 mL/min (ref >60)
Glucose, Bld: 103 mg/dL — ABNORMAL HIGH (ref 70–99)
Potassium: 4 mmol/L (ref 3.5–5.1)
Sodium: 136 mmol/L (ref 135–145)

## 2024-06-03 LAB — CBC
HCT: 29.4 % — ABNORMAL LOW (ref 36.0–46.0)
Hemoglobin: 8.9 g/dL — ABNORMAL LOW (ref 12.0–15.0)
MCH: 21.2 pg — ABNORMAL LOW (ref 26.0–34.0)
MCHC: 30.3 g/dL (ref 30.0–36.0)
MCV: 70.2 fL — ABNORMAL LOW (ref 80.0–100.0)
Platelets: 322 K/uL (ref 150–400)
RBC: 4.19 MIL/uL (ref 3.87–5.11)
RDW: 17 % — ABNORMAL HIGH (ref 11.5–15.5)
WBC: 12.5 K/uL — ABNORMAL HIGH (ref 4.0–10.5)
nRBC: 0 % (ref 0.0–0.2)

## 2024-06-03 LAB — URINALYSIS, ROUTINE W REFLEX MICROSCOPIC
Bilirubin Urine: NEGATIVE
Glucose, UA: NEGATIVE mg/dL
Hgb urine dipstick: NEGATIVE
Ketones, ur: NEGATIVE mg/dL
Leukocytes,Ua: NEGATIVE
Nitrite: NEGATIVE
Protein, ur: NEGATIVE mg/dL
Specific Gravity, Urine: 1.024 (ref 1.005–1.030)
pH: 5 (ref 5.0–8.0)

## 2024-06-03 LAB — POC URINE PREG, ED: Preg Test, Ur: NEGATIVE

## 2024-06-03 LAB — LIPASE, BLOOD: Lipase: 32 U/L (ref 11–51)

## 2024-06-03 MED ORDER — OXYCODONE-ACETAMINOPHEN 5-325 MG PO TABS
1.0000 | ORAL_TABLET | ORAL | Status: AC | PRN
  Administered 2024-06-03 – 2024-06-04 (×2): 1 via ORAL
  Filled 2024-06-03 (×2): qty 1

## 2024-06-03 MED ORDER — FENTANYL CITRATE (PF) 50 MCG/ML IJ SOSY
50.0000 ug | PREFILLED_SYRINGE | Freq: Once | INTRAMUSCULAR | Status: AC
  Administered 2024-06-04: 50 ug via INTRAVENOUS
  Filled 2024-06-03: qty 1

## 2024-06-03 MED ORDER — LIDOCAINE 5 % EX PTCH
1.0000 | MEDICATED_PATCH | CUTANEOUS | Status: DC
  Administered 2024-06-03: 1 via TRANSDERMAL
  Filled 2024-06-03: qty 1

## 2024-06-03 MED ORDER — ONDANSETRON HCL 4 MG/2ML IJ SOLN
4.0000 mg | Freq: Once | INTRAMUSCULAR | Status: AC
  Administered 2024-06-03: 4 mg via INTRAVENOUS
  Filled 2024-06-03: qty 2

## 2024-06-03 NOTE — ED Triage Notes (Addendum)
 Pt arrives POV, ambulatory to triage, gait steady, no acute distress noted. Pt reporting right flank pain that started Monday that has progressively worsened over the past few days. Pt states pain is now radiating from flank to right lower back/hip area described as burning/sharp and pt reports her urine has been darker than normal. Denies PMH. Denies n/v. Pt last took tylenol  around 1500 and ibuprofen around 1600 without relief.

## 2024-06-04 ENCOUNTER — Emergency Department: Payer: Self-pay

## 2024-06-04 MED ORDER — LIDOCAINE 5 % EX PTCH: 1.0000 | MEDICATED_PATCH | CUTANEOUS | 0 refills | Status: AC

## 2024-06-04 MED ORDER — KETOROLAC TROMETHAMINE 15 MG/ML IJ SOLN
15.0000 mg | Freq: Once | INTRAMUSCULAR | Status: AC
  Administered 2024-06-04: 15 mg via INTRAVENOUS
  Filled 2024-06-04: qty 1

## 2024-06-04 MED ORDER — IBUPROFEN 600 MG PO TABS: 600.0000 mg | ORAL_TABLET | Freq: Four times a day (QID) | ORAL | 0 refills | Status: AC | PRN

## 2024-06-04 MED ORDER — IOHEXOL 300 MG/ML  SOLN
100.0000 mL | Freq: Once | INTRAMUSCULAR | Status: AC | PRN
  Administered 2024-06-04: 100 mL via INTRAVENOUS

## 2024-06-04 NOTE — Discharge Instructions (Addendum)
 Take ibuprofen with Tylenol  1 g every 8 hours over the next 1 week to try to help with symptoms.  I suspect this is most likely musculoskeletal given reassuring CT imaging as well as ultrasound.  If you develop pain in the lower pelvic area, vaginal discharge and please return to the ER for repeat evaluation.  Given the your uterine fibroids noted on ultrasound you should follow-up with an OB/GYN and discuss further your iron deficient anemia.

## 2024-06-04 NOTE — ED Provider Notes (Addendum)
 [facility] Provider Note    Event Date/Time   First MD Initiated Contact with Patient 06/03/24 2316     (approximate)   History   Flank Pain   HPI  Jaime Griffin is a 33 y.o. female who is otherwise healthy who comes in with concerns for right flank pain/ right upper quadrant pain.  Patient reports right upper quadrant pain radiating down into her right flank.  She states that occasionally it radiates down into her right lower abdomen but it seems to mostly be in the right upper abdomen she denies any overt chest pain, shortness of breath or any other concerns.  She reports this been going on for 3 days she has been alternating Tylenol , ibuprofen but she still not had any relief in symptoms therefore she came in today for evaluation.  She denies any vaginal discharge or concerns for new sexual partners.  She reports that she does sleep with her 53-year-old son and sometimes he tosses and turns in the sleep and so it is possible that she could have injured herself.  She does report that the pain is worse when she does certain movements.  She denies any saddle anesthesia, bowel or urinary incontinence, weakness or numbness in her legs. NO chest pain, NO SOB.   Physical Exam   Triage Vital Signs: ED Triage Vitals  Encounter Vitals Group     BP 06/03/24 2050 116/73     Girls Systolic BP Percentile --      Girls Diastolic BP Percentile --      Boys Systolic BP Percentile --      Boys Diastolic BP Percentile --      Pulse Rate 06/03/24 2050 91     Resp 06/03/24 2050 18     Temp 06/03/24 2050 98.3 F (36.8 C)     Temp Source 06/03/24 2050 Oral     SpO2 06/03/24 2050 99 %     Weight --      Height --      Head Circumference --      Peak Flow --      Pain Score 06/03/24 2054 10     Pain Loc --      Pain Education --      Exclude from Growth Chart --     Most recent vital signs: Vitals:   06/03/24 2050  BP: 116/73  Pulse: 91  Resp: 18  Temp: 98.3 F  (36.8 C)  SpO2: 99%     General: Awake, no distress.  CV:  Good peripheral perfusion.  Resp:  Normal effort.  Abd:  No distention.  Tender mostly in the right upper quadrant a little bit of the right mid abdomen. Other:  No rash noted.  Equal strength in legs.  Sensation intact.  Patient is ambulatory but does report pain when she initially sits up and then reports pain with movement of her right leg.  There is no warmth or redness noted over the right hip. No swelling in legs.   ED Results / Procedures / Treatments   Labs (all labs ordered are listed, but only abnormal results are displayed) Labs Reviewed  URINALYSIS, ROUTINE W REFLEX MICROSCOPIC - Abnormal; Notable for the following components:      Result Value   Color, Urine YELLOW (*)    APPearance HAZY (*)    All other components within normal limits  BASIC METABOLIC PANEL WITH GFR - Abnormal; Notable for the following components:  Glucose, Bld 103 (*)    Calcium 8.5 (*)    All other components within normal limits  CBC - Abnormal; Notable for the following components:   WBC 12.5 (*)    Hemoglobin 8.9 (*)    HCT 29.4 (*)    MCV 70.2 (*)    MCH 21.2 (*)    RDW 17.0 (*)    All other components within normal limits  HEPATIC FUNCTION PANEL  LIPASE, BLOOD  POC URINE PREG, ED    RADIOLOGY I have reviewed the us  personally and interpreted no evidence of gallstones   PROCEDURES:  Critical Care performed: No  Procedures   MEDICATIONS ORDERED IN ED: Medications  oxyCODONE-acetaminophen  (PERCOCET/ROXICET) 5-325 MG per tablet 1 tablet (1 tablet Oral Given 06/03/24 2056)  lidocaine (LIDODERM) 5 % 1 patch (1 patch Transdermal Patch Applied 06/03/24 2359)  fentaNYL (SUBLIMAZE) injection 50 mcg (50 mcg Intravenous Given 06/04/24 0000)  ondansetron (ZOFRAN) injection 4 mg (4 mg Intravenous Given 06/03/24 2359)     IMPRESSION / MDM / ASSESSMENT AND PLAN / ED COURSE  I reviewed the triage vital signs and the nursing  notes.   Patient's presentation is most consistent with acute presentation with potential threat to life or bodily function.   Differential includes gallstones, cholecystitis choledocholithiasis, appendicitis, kidney stones.  Patient will be treated with some IV fentanyl IV Zofran as patient was given oxycodone still having significant pain.  Pregnancy test was negative urine without evidence of UTI BMP reassuring CBC with elevated white count.  Does have low hemoglobin that looks like that is at baseline for patient hepatic function and lipase are normal  Given patient's pain with elevated white count we will proceed with CT imaging to rule out appendicitis given right mid pain (and elevated wbc), kidney stones as well as get an ultrasound to rule out GB pathology.  She does not have  pelvic pain to suggest  ovarian torsion, PID, Ovarian abscess. NO vaginal discharge or new partners.  US  negative   IMPRESSION: 1. No acute findings in the abdomen or pelvis. 2. Normal appendix. 3. Uterine fibroids.  Reevaluated patient and updated on reassuring results.  We discussed that this is most likely musculoskeletal in nature as worse with certain movements  We discussed pelvic exam but she denies any vaginal discharge or any pelvic pain.  On repeat abdominal exam she has got no right lower quadrant tenderness.  We discussed pelvic ultrasound but again this does not seem consistent with ovarian torsion and feels her pain is truly in the right upper to right mid abdomen.  We discussed treatment with Tylenol , ibuprofen, lidocaine patches and return to ER if she develops fevers, worsening symptoms or any other concerns.  We did discuss her uterine fibroids she does report heavy periods and has known history of iron deficient anemia.  She understands she can follow this up with her OB/GYN.     FINAL CLINICAL IMPRESSION(S) / ED DIAGNOSES   Final diagnoses:  Right flank pain  Right upper quadrant  abdominal pain     Rx / DC Orders   ED Discharge Orders          Ordered    ibuprofen (ADVIL) 600 MG tablet  Every 6 hours PRN        06/04/24 0135    lidocaine (LIDODERM) 5 %  Every 24 hours        06/04/24 0135             Note:  This document was prepared using Dragon voice recognition software and may include unintentional dictation errors.   Ernest Ronal BRAVO, MD 06/04/24 0136    Ernest Ronal BRAVO, MD 06/04/24 [phone]
